# Patient Record
Sex: Male | Born: 1990 | Race: Black or African American | Hispanic: No | Marital: Married | Smoking: Never smoker
Health system: Southern US, Community
[De-identification: ages and names within clinical notes are randomized; demographics above are authoritative.]

## PROBLEM LIST (undated history)

## (undated) HISTORY — PX: TONSILLECTOMY: SUR1361

## (undated) HISTORY — PX: HERNIA REPAIR: SHX51

## (undated) HISTORY — PX: ADENOIDECTOMY: SUR15

---
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
## 2010-11-07 LAB — CBC
Hemoglobin: 15.3 g/dL (ref 12.0–16.0)
MCHC: 34.3 g/dL (ref 31.0–37.0)
Platelets: 328 10*3/uL (ref 150–400)
RBC: 5.09 MIL/uL (ref 3.80–5.70)
RDW: 11.4 % (ref 11.4–15.5)
WBC: 7.2 10*3/uL (ref 4.5–13.5)

## 2010-11-07 LAB — DIFFERENTIAL
Basophils Absolute: 0 10*3/uL (ref 0.0–0.1)
Basophils Relative: 1 % (ref 0–1)
Eosinophils Absolute: 0.1 10*3/uL (ref 0.0–1.2)
Lymphocytes Relative: 18 % — ABNORMAL LOW (ref 24–48)
Monocytes Relative: 11 % (ref 3–11)
Neutro Abs: 5 10*3/uL (ref 1.7–8.0)

## 2010-11-07 LAB — COMPREHENSIVE METABOLIC PANEL
ALT: 26 U/L (ref 0–53)
Albumin: 4.5 g/dL (ref 3.5–5.2)
Alkaline Phosphatase: 110 U/L (ref 52–171)
Glucose, Bld: 85 mg/dL (ref 70–99)
Total Bilirubin: 0.6 mg/dL (ref 0.3–1.2)
Total Protein: 8.5 g/dL — ABNORMAL HIGH (ref 6.0–8.3)

## 2016-11-23 ENCOUNTER — Encounter (HOSPITAL_COMMUNITY): Payer: Self-pay | Admitting: Emergency Medicine

## 2016-11-23 ENCOUNTER — Ambulatory Visit (HOSPITAL_COMMUNITY)
Admission: EM | Admit: 2016-11-23 | Discharge: 2016-11-23 | Disposition: A | Discharge status: Home / Self Care | Payer: Managed Care, Other (non HMO) | Attending: Internal Medicine | Admitting: Internal Medicine

## 2016-11-23 DIAGNOSIS — R109 Unspecified abdominal pain: Secondary | ICD-10-CM

## 2016-11-23 NOTE — Discharge Instructions (Addendum)
Abdominal pain episodes are sharp and brief (seconds).  No danger signs on exam.  Would observe at this point, and recheck for further evaluation if having episodes that are longer (minutes to hours), occurring many times daily, or with new fever >100.5.  Does not seem consistent with appendicitis, kidney stone.  Could be caused by intestinal inflammation or infection (which may resolve on its own) or abdominal wall pain.

## 2016-11-23 NOTE — ED Triage Notes (Signed)
The patient presented to the [facility] with a complaint of lower right side abdominal pain that started yesterday am. The patient denied any N/V/D.

## 2016-11-23 NOTE — ED Provider Notes (Signed)
[facility]    CSN: [id number] Arrival date & time: 11/23/16  4010     History   Chief Complaint Chief Complaint  Patient presents with  . Abdominal Pain    HPI Ruben Banks is a 26 y.o. male. He presents today with 3 episodes, lasting a couple of seconds each time, sharp right lower quadrant discomfort, over the last 24 hours. No nausea/vomiting, appetite is good. No change in bowel habits, had normal bowel movements 2 today which is normal for him. No new urinary frequency, urgency, no discomfort with urination, no hematuria. No fever, no malaise. No upper respiratory symptoms, no cough, no runny/congested nose, no sore throat. Not feeling achy. He had similar symptoms in January of this year, lasted for a couple days and then resolved. He has not been lifting weights, no trauma.    HPI  History reviewed. No pertinent past medical history.   Past Surgical History:  Procedure Laterality Date  . HERNIA REPAIR    . TONSILLECTOMY         Home Medications   Takes no meds regularly  Family History History reviewed. No pertinent family history.  Social History Social History  Substance Use Topics  . Smoking status: Never Smoker  . Smokeless tobacco: Never Used  . Alcohol use No     Allergies   Patient has no known allergies.   Review of Systems Review of Systems  All other systems reviewed and are negative.    Physical Exam Triage Vital Signs ED Triage Vitals  Enc Vitals Group     BP 11/23/16 1919 (!) 150/77     Pulse Rate 11/23/16 1919 84     Resp 11/23/16 1919 18     Temp 11/23/16 1919 98.3 F (36.8 C)     Temp Source 11/23/16 1919 Oral     SpO2 11/23/16 1919 97 %     Weight --      Height --      Pain Score 11/23/16 1918 0     Pain Loc --    Updated Vital Signs BP (!) 150/77 (BP Location: Right Arm)   Pulse 84   Temp 98.3 F (36.8 C) (Oral)   Resp 18   SpO2 97%   Physical Exam  Constitutional: He is oriented to person,  place, and time. No distress.  Alert, nicely groomed  HENT:  Head: Atraumatic.  Eyes:  Conjugate gaze, no eye redness/drainage  Neck: Neck supple.  Cardiovascular: Normal rate and regular rhythm.   Pulmonary/Chest: No respiratory distress. He has no wheezes. He has no rales.  Lungs clear, symmetric breath sounds  Abdominal: Soft. He exhibits no distension. There is no rebound and no guarding. No hernia.  Mild tenderness in the right lower quadrant, diffuse, to deep palpation  Musculoskeletal: Normal range of motion.  Neurological: He is alert and oriented to person, place, and time.  Skin: Skin is warm and dry.  No cyanosis  Nursing note and vitals reviewed.    UC Treatments / Results   Procedures Procedures (including critical care time)  Final Clinical Impressions(s) / UC Diagnoses   Final diagnoses:  Acute abdominal pain   Abdominal pain episodes are sharp and brief (seconds).  No danger signs on exam.  Would observe at this point, and recheck for further evaluation if having episodes that are longer (minutes to hours), occurring many times daily, or with new fever >100.5.  Does not seem consistent with appendicitis, kidney stone.  Could  be caused by intestinal inflammation or infection (which may resolve on its own) or abdominal wall pain.    Eustace Moore, MD 11/26/16 [phone]

## 2016-12-02 ENCOUNTER — Emergency Department (HOSPITAL_BASED_OUTPATIENT_CLINIC_OR_DEPARTMENT_OTHER)
Admission: EM | Admit: 2016-12-02 | Discharge: 2016-12-02 | Disposition: A | Discharge status: Home / Self Care | Payer: Managed Care, Other (non HMO) | Attending: Emergency Medicine | Admitting: Emergency Medicine

## 2016-12-02 ENCOUNTER — Encounter (HOSPITAL_BASED_OUTPATIENT_CLINIC_OR_DEPARTMENT_OTHER): Payer: Self-pay | Admitting: *Deleted

## 2016-12-02 ENCOUNTER — Emergency Department (HOSPITAL_BASED_OUTPATIENT_CLINIC_OR_DEPARTMENT_OTHER): Payer: Managed Care, Other (non HMO)

## 2016-12-02 DIAGNOSIS — R1033 Periumbilical pain: Secondary | ICD-10-CM | POA: Insufficient documentation

## 2016-12-02 DIAGNOSIS — R109 Unspecified abdominal pain: Secondary | ICD-10-CM

## 2016-12-02 DIAGNOSIS — R1031 Right lower quadrant pain: Secondary | ICD-10-CM | POA: Diagnosis not present

## 2016-12-02 LAB — CBC WITH DIFFERENTIAL/PLATELET
BASOS PCT: 1 %
Basophils Absolute: 0.1 10*3/uL (ref 0.0–0.1)
Eosinophils Absolute: 0.1 10*3/uL (ref 0.0–0.7)
Eosinophils Relative: 2 %
HEMATOCRIT: 44.2 % (ref 39.0–52.0)
HEMOGLOBIN: 15.6 g/dL (ref 13.0–17.0)
Lymphocytes Relative: 29 %
Lymphs Abs: 2.2 10*3/uL (ref 0.7–4.0)
MCH: 30.2 pg (ref 26.0–34.0)
MCHC: 35.3 g/dL (ref 30.0–36.0)
MCV: 85.5 fL (ref 78.0–100.0)
MONOS PCT: 13 %
Monocytes Absolute: 1 10*3/uL (ref 0.1–1.0)
NEUTROS ABS: 4.2 10*3/uL (ref 1.7–7.7)
Neutrophils Relative %: 55 %
Platelets: 403 10*3/uL — ABNORMAL HIGH (ref 150–400)
RBC: 5.17 MIL/uL (ref 4.22–5.81)
RDW: 11.9 % (ref 11.5–15.5)
WBC: 7.5 10*3/uL (ref 4.0–10.5)

## 2016-12-02 LAB — URINALYSIS, MICROSCOPIC (REFLEX): RBC / HPF: NONE SEEN RBC/hpf (ref 0–5)

## 2016-12-02 LAB — BASIC METABOLIC PANEL
ANION GAP: 8 (ref 5–15)
BUN: 11 mg/dL (ref 6–20)
CO2: 26 mmol/L (ref 22–32)
CREATININE: 0.77 mg/dL (ref 0.61–1.24)
Calcium: 9.1 mg/dL (ref 8.9–10.3)
Chloride: 99 mmol/L — ABNORMAL LOW (ref 101–111)
GFR calc non Af Amer: >60 mL/min (ref >60)
Glucose, Bld: 100 mg/dL — ABNORMAL HIGH (ref 65–99)
POTASSIUM: 3.9 mmol/L (ref 3.5–5.1)
Sodium: 133 mmol/L — ABNORMAL LOW (ref 135–145)

## 2016-12-02 LAB — URINALYSIS, ROUTINE W REFLEX MICROSCOPIC
Bilirubin Urine: NEGATIVE
Glucose, UA: NEGATIVE mg/dL
Hgb urine dipstick: NEGATIVE
KETONES UR: NEGATIVE mg/dL
LEUKOCYTES UA: NEGATIVE
NITRITE: NEGATIVE
PH: 5.5 (ref 5.0–8.0)
Protein, ur: 100 mg/dL — AB
SPECIFIC GRAVITY, URINE: 1.019 (ref 1.005–1.030)

## 2016-12-02 MED ORDER — IBUPROFEN 600 MG PO TABS: 600.0000 mg | ORAL_TABLET | Freq: Four times a day (QID) | ORAL | 0 refills | Status: AC | PRN

## 2016-12-02 NOTE — ED Provider Notes (Signed)
[facility]   CSN: [id number] Arrival date & time: 12/02/16  1940   History   Chief Complaint Chief Complaint  Patient presents with  . Abdominal Pain    HPI Ruben Banks is a 26 y.o. male.  HPI  At first it also is feeling like his muscles were stretching. Pain was intermittent and. However yesterday pain change in started becoming more constant. States that he is having pain every time he moves. Such activities as walking, riding in a car causing abdominal pain. Describes the pain as a needing no: 2 stomach. He had abdominal pain like this a year ago but it resolved. Denies any changes in stool, nausea, vomiting. No trauma to the abdomen. States that he feels like it feels a little firmer on the right but is not able to feel any discrete mass. Denies any back pain, dysuria. Pain localized to the right of the umbilicus. Denies any abdominal surgeries. No current pain.   History reviewed. No pertinent past medical history.  There are no active problems to display for this patient.   Past Surgical History:  Procedure Laterality Date  . HERNIA REPAIR    . TONSILLECTOMY      Home Medications    Prior to Admission medications   Not on File    Family History No family history on file.  Social History Social History  Substance Use Topics  . Smoking status: Never Smoker  . Smokeless tobacco: Never Used  . Alcohol use No    Allergies   Patient has no known allergies.   Review of Systems Review of Systems  Constitutional: Negative for appetite change and fever.  Respiratory: Negative for shortness of breath.   Cardiovascular: Negative for chest pain.  Gastrointestinal: Positive for abdominal pain. Negative for blood in stool, constipation, diarrhea, nausea and vomiting.  Genitourinary: Negative for dysuria.  Musculoskeletal: Negative for back pain.  Also per HPI  Physical Exam Updated Vital Signs BP 133/80 (BP Location: Right Arm)    Pulse 60   Temp 98.5 F (36.9 C) (Oral)   Resp 16   Ht 5\' 7"  (1.702 m)   Wt 104.3 kg   SpO2 98%   BMI 36.02 kg/m   Physical Exam  Constitutional: He appears well-developed and well-nourished. No distress.  HENT:  Head: Normocephalic and atraumatic.  Mouth/Throat: Oropharynx is clear and moist.  Eyes: Conjunctivae and EOM are normal. Pupils are equal, round, and reactive to light.  Neck: Normal range of motion. Neck supple.  Cardiovascular: Normal rate, regular rhythm and normal heart sounds.   Pulmonary/Chest: Effort normal and breath sounds normal.  Abdominal: Soft. Bowel sounds are normal. He exhibits no distension and no mass. There is no hepatosplenomegaly. There is tenderness in the periumbilical area.  Mild voluntary guarding.   Musculoskeletal: Normal range of motion. He exhibits tenderness.  Neurological: He is alert. No cranial nerve deficit or sensory deficit.  Skin: Skin is warm and dry.  Nursing note and vitals reviewed.   ED Treatments / Results  Labs (all labs ordered are listed, but only abnormal results are displayed) Labs Reviewed  URINALYSIS, ROUTINE W REFLEX MICROSCOPIC - Abnormal; Notable for the following:       Result Value   Protein, ur 100 (*)    All other components within normal limits  CBC WITH DIFFERENTIAL/PLATELET - Abnormal; Notable for the following:    Platelets 403 (*)    All other components within normal limits  BASIC METABOLIC  PANEL - Abnormal; Notable for the following:    Sodium 133 (*)    Chloride 99 (*)    Glucose, Bld 100 (*)    All other components within normal limits  URINALYSIS, MICROSCOPIC (REFLEX) - Abnormal; Notable for the following:    Bacteria, UA RARE (*)    Squamous Epithelial / LPF 0-5 (*)    All other components within normal limits    EKG  EKG Interpretation None       Radiology Dg Abdomen Acute W/chest  Result Date: 12/02/2016 CLINICAL DATA:  26 y/o M; right lower quadrant and periumbilical pain. EXAM:  DG ABDOMEN ACUTE W/ 1V CHEST COMPARISON:  03/29/2004 chest radiograph. FINDINGS: Normal bowel gas pattern. No radiopaque urinary stone is identified. No appreciable pneumoperitoneum. Clear lungs. No pleural effusion or pneumothorax. Normal cardiac silhouette. Mild reverse S curvature of the thoracolumbar spine. No acute osseous abnormality is evident. IMPRESSION: Negative abdominal radiographs.  No acute cardiopulmonary disease. Electronically Signed   By: Mitzi Hansen M.D.   On: 12/02/2016 22:01    Procedures Procedures (including critical care time)  Medications Ordered in ED Medications - No data to display   Initial Impression / Assessment and Plan / ED Course  I have reviewed the triage vital signs and the nursing notes.  Pertinent labs & imaging results that were available during my care of the patient were reviewed by me and considered in my medical decision making (see chart for details).  Patient presents to ED with intermittent abdominal pain of unknown origin. Benign exam here. Vitals are stable and patient afebrile. Abdominal imaging obtained that was unremarkable. Blood work was also benign. Patient did have protein in his urine which he endorses having before. No current pain at this time. Will discharge home in stable condition with follow-up with PCP for further work-up.   Final Clinical Impressions(s) / ED Diagnoses   Final diagnoses:  Abdominal pain, unspecified abdominal location    New Prescriptions New Prescriptions   No medications on file     Pincus Large, DO 12/03/16 1159    Long, Arlyss Repress, MD 12/03/16 1535

## 2016-12-02 NOTE — Discharge Instructions (Signed)
Work-up unremarkable Imaging was normal Recommend you follow-up with your PCP for further testing and possible referral to specialist

## 2016-12-02 NOTE — ED Notes (Signed)
Pt in radiology. Brother at bedside.

## 2016-12-02 NOTE — ED Triage Notes (Signed)
Abdominal pain that feels like a knot that gets worse with stretching, sitting and walking. Right lower quadrant pain.

## 2016-12-05 ENCOUNTER — Ambulatory Visit
Admission: RE | Admit: 2016-12-05 | Discharge: 2016-12-05 | Disposition: A | Discharge status: Home / Self Care | Payer: Managed Care, Other (non HMO) | Source: Ambulatory Visit | Attending: Family Medicine | Admitting: Family Medicine

## 2016-12-05 ENCOUNTER — Other Ambulatory Visit: Payer: Self-pay | Admitting: Family Medicine

## 2016-12-05 DIAGNOSIS — R1033 Periumbilical pain: Secondary | ICD-10-CM

## 2016-12-05 IMAGING — CT CT ABD-PELV W/ CM
1 of 2 series · 15 of 32 positions shown, 19 images · IV contrast (APPLIED)
Comparison: None.

CLINICAL DATA: Periumbilical abdominal for 3 weeks.

EXAM:
CT ABDOMEN AND PELVIS WITH CONTRAST
TECHNIQUE: Multidetector CT imaging of the abdomen and pelvis was performed
using the standard protocol following bolus administration of
intravenous contrast.
CONTRAST:  100 mL [3N]

[Series 2: abd/pelvis w/cm · axial · 0.83mm/px · z∈[-469,-24]mm · 15 of 97 slices shown, 19 images]
[im 4/97  soft-tissue]
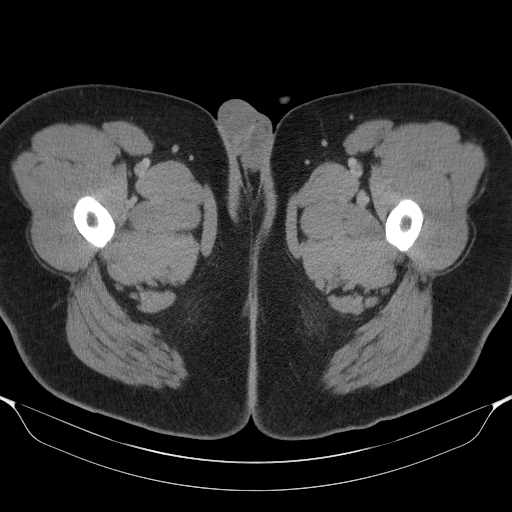
[im 4/97  bone]
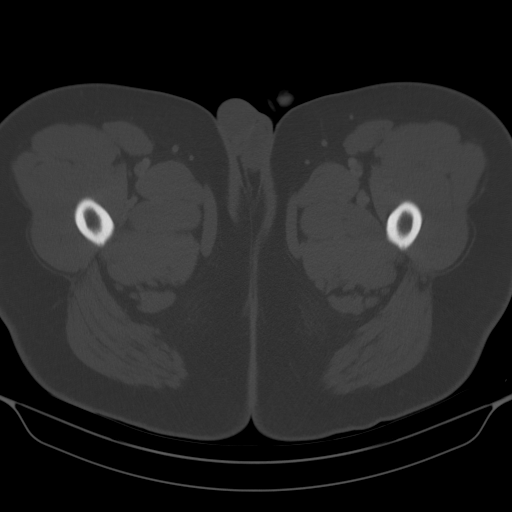
[im 12/97  soft-tissue]
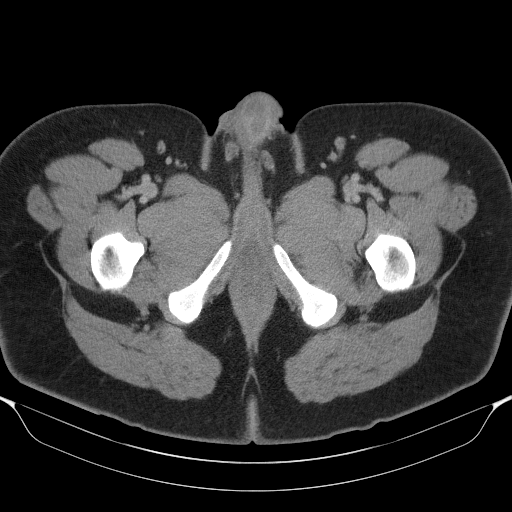
[im 20/97  soft-tissue]
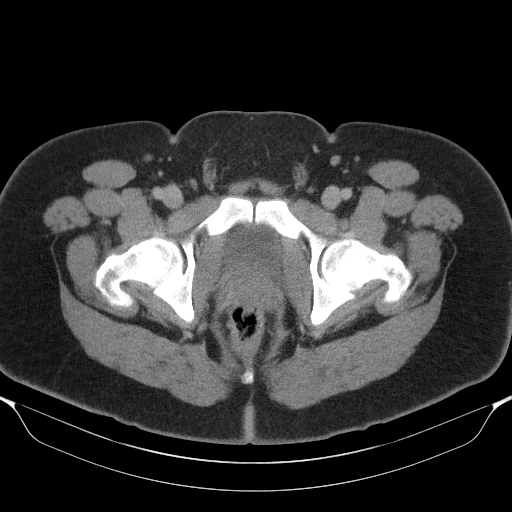
[im 27/97  soft-tissue]
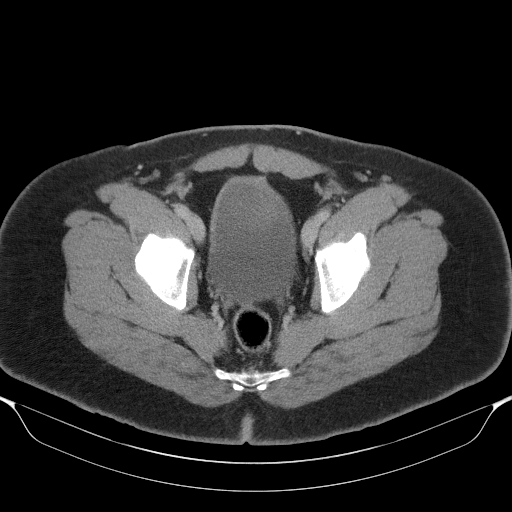
[im 35/97  soft-tissue]
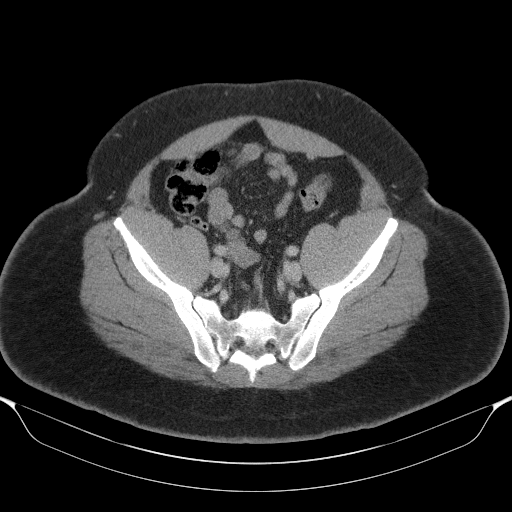
[im 43/97  soft-tissue]
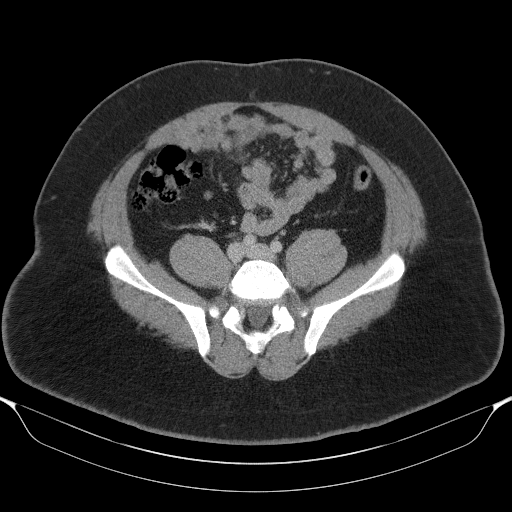
[im 50/97  soft-tissue]
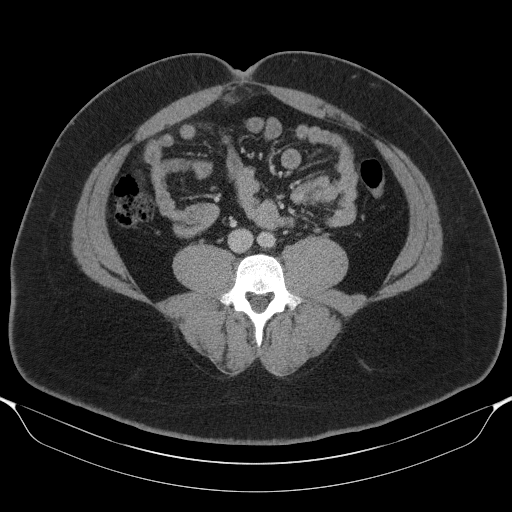
[im 54/97  soft-tissue]
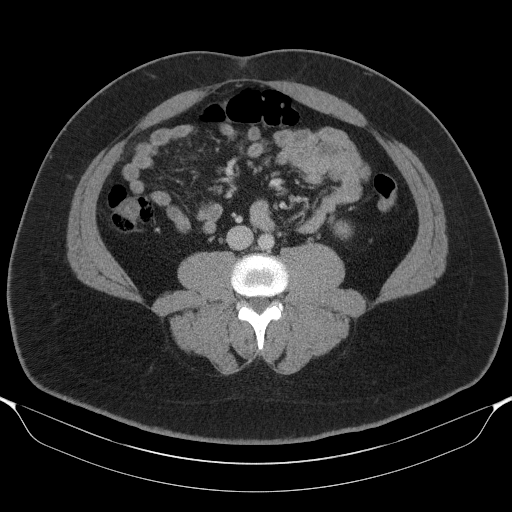
[im 62/97  soft-tissue]
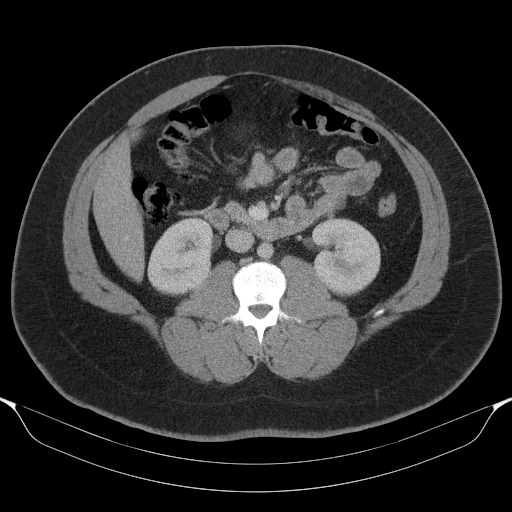
[im 62/97  bone]
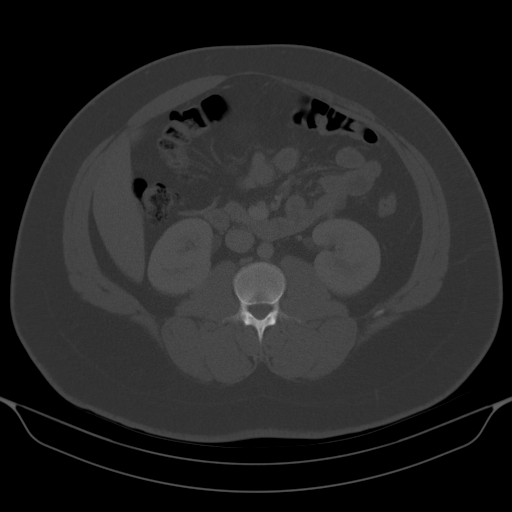
[im 70/97  soft-tissue]
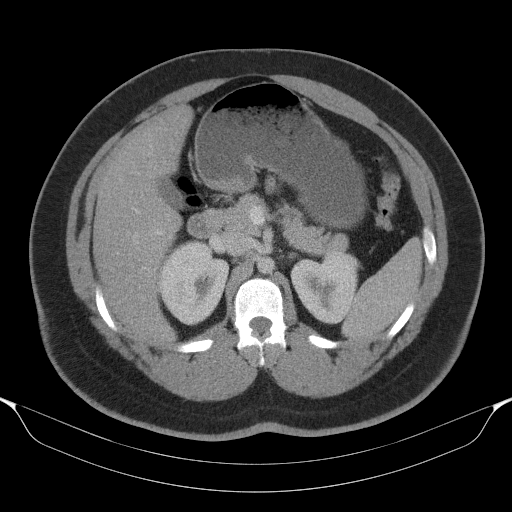
[im 77/97  soft-tissue]
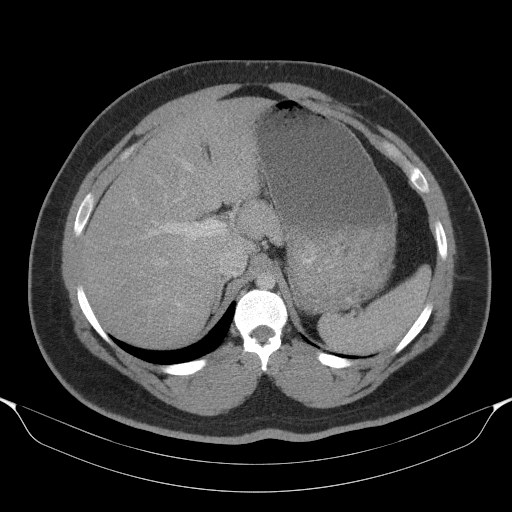
[im 81/97  lung]
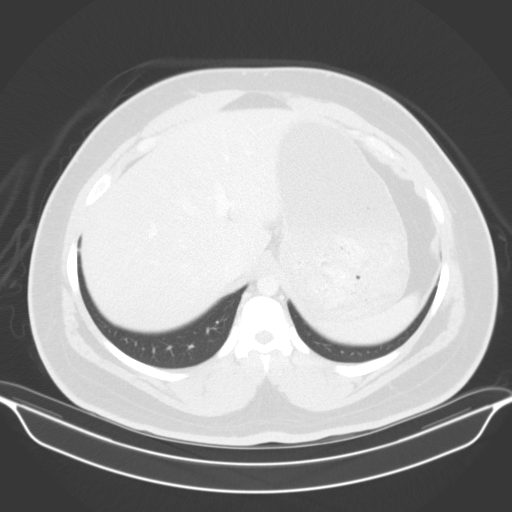
[im 85/97  soft-tissue]
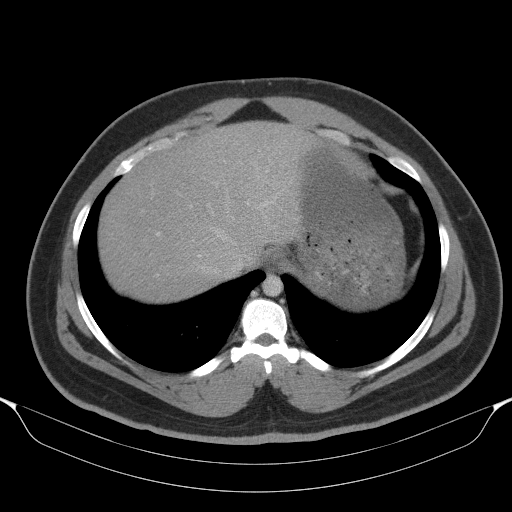
[im 85/97  lung]
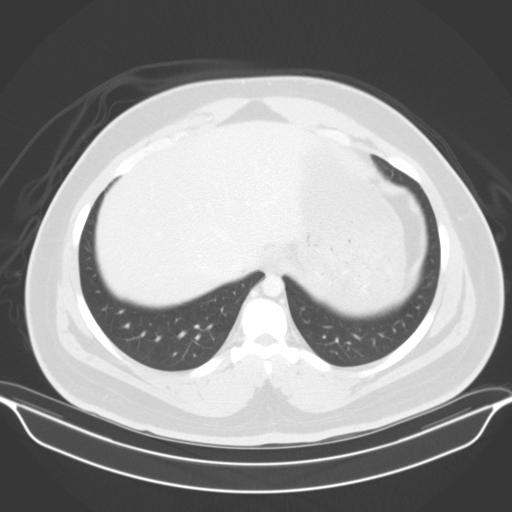
[im 89/97  lung]
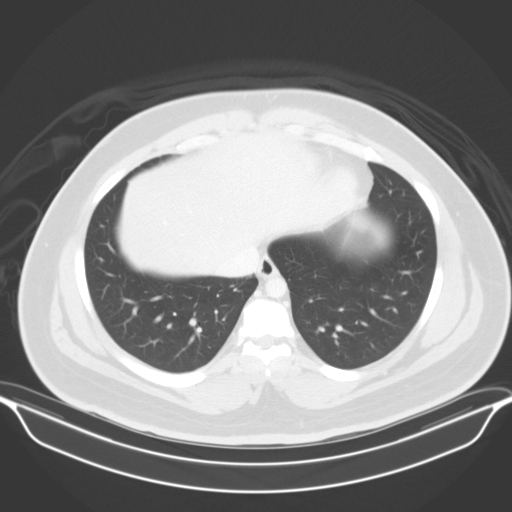
[im 93/97  soft-tissue]
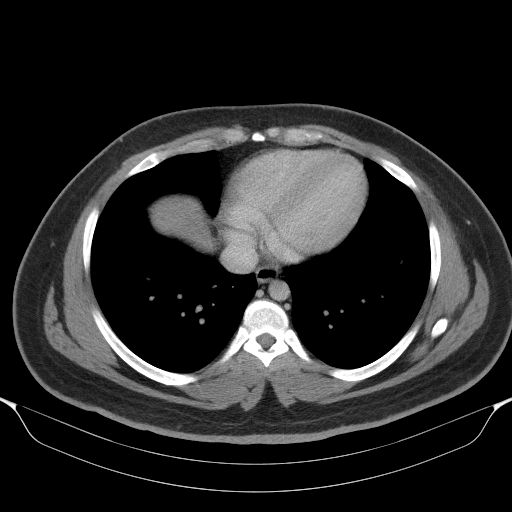
[im 93/97  lung]
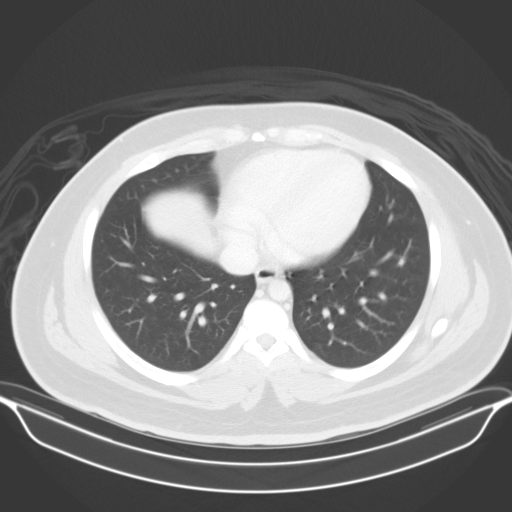

[15 of 32 positions shown; findings below may reference images not displayed]

FINDINGS: Lower Chest: No acute findings.

Hepatobiliary: No masses identified. Mild to moderate diffuse
hepatic steatosis. Gallbladder is unremarkable.

Pancreas:  No mass or inflammatory changes.

Spleen: Within normal limits in size and appearance.

Adrenals/Urinary Tract: No masses identified. No evidence of
hydronephrosis. Unremarkable unopacified urinary bladder.

Stomach/Bowel: No evidence of obstruction, inflammatory process or
abnormal fluid collections.

Vascular/Lymphatic: No pathologically enlarged lymph nodes. No
abdominal aortic aneurysm. Normal appendix visualized.

Reproductive:  No mass or other significant abnormality.

Other: A tiny periumbilical hernia is seen fat. There is mild soft
tissue nodularity within omentum just deep to hernia site which may
be due to fat necrosis. No evidence herniated bowel loops.

Musculoskeletal: No suspicious bone lesions identified. Bilateral L5
pars defects incidentally noted, without associated
spondylolisthesis.
IMPRESSION: Tiny fat-containing periumbilical hernia. Associated mild soft
tissue nodularity within omentum just deep to hernia site, which may
be due to fat necrosis. Consider followup by CT in 3-6 months to
confirm stability.

Hepatic steatosis and bilateral L5 pars defects incidentally noted.

## 2016-12-05 MED ORDER — IOPAMIDOL (ISOVUE-300) INJECTION 61%
100.0000 mL | Freq: Once | INTRAVENOUS | Status: AC | PRN
  Administered 2016-12-05: 100 mL via INTRAVENOUS

## 2019-05-21 ENCOUNTER — Encounter (HOSPITAL_BASED_OUTPATIENT_CLINIC_OR_DEPARTMENT_OTHER): Payer: Self-pay | Admitting: Emergency Medicine

## 2019-05-21 ENCOUNTER — Emergency Department (HOSPITAL_BASED_OUTPATIENT_CLINIC_OR_DEPARTMENT_OTHER)
Admission: EM | Admit: 2019-05-21 | Discharge: 2019-05-21 | Disposition: A | Discharge status: Home / Self Care | Payer: Self-pay | Attending: Emergency Medicine | Admitting: Emergency Medicine

## 2019-05-21 ENCOUNTER — Other Ambulatory Visit: Payer: Self-pay

## 2019-05-21 ENCOUNTER — Emergency Department (HOSPITAL_BASED_OUTPATIENT_CLINIC_OR_DEPARTMENT_OTHER): Payer: Self-pay

## 2019-05-21 DIAGNOSIS — J029 Acute pharyngitis, unspecified: Secondary | ICD-10-CM | POA: Insufficient documentation

## 2019-05-21 DIAGNOSIS — R509 Fever, unspecified: Secondary | ICD-10-CM | POA: Insufficient documentation

## 2019-05-21 DIAGNOSIS — R519 Headache, unspecified: Secondary | ICD-10-CM | POA: Insufficient documentation

## 2019-05-21 LAB — CBC WITH DIFFERENTIAL/PLATELET
Abs Immature Granulocytes: 0.05 10*3/uL (ref 0.00–0.07)
Basophils Absolute: 0 10*3/uL (ref 0.0–0.1)
Basophils Relative: 1 %
Eosinophils Absolute: 0.1 10*3/uL (ref 0.0–0.5)
Eosinophils Relative: 2 %
HCT: 39.4 % (ref 39.0–52.0)
Hemoglobin: 13 g/dL (ref 13.0–17.0)
Immature Granulocytes: 1 %
Lymphocytes Relative: 16 %
Lymphs Abs: 1.3 10*3/uL (ref 0.7–4.0)
MCH: 28 pg (ref 26.0–34.0)
MCHC: 33 g/dL (ref 30.0–36.0)
MCV: 84.7 fL (ref 80.0–100.0)
Monocytes Absolute: 1.2 10*3/uL — ABNORMAL HIGH (ref 0.1–1.0)
Monocytes Relative: 15 %
Neutro Abs: 5.2 10*3/uL (ref 1.7–7.7)
Neutrophils Relative %: 65 %
Platelets: 341 10*3/uL (ref 150–400)
RBC: 4.65 MIL/uL (ref 4.22–5.81)
RDW: 11.3 % — ABNORMAL LOW (ref 11.5–15.5)
WBC: 7.8 10*3/uL (ref 4.0–10.5)
nRBC: 0 % (ref 0.0–0.2)

## 2019-05-21 LAB — BASIC METABOLIC PANEL
Anion gap: 10 (ref 5–15)
BUN: 7 mg/dL (ref 6–20)
CO2: 21 mmol/L — ABNORMAL LOW (ref 22–32)
Calcium: 8.7 mg/dL — ABNORMAL LOW (ref 8.9–10.3)
Chloride: 101 mmol/L (ref 98–111)
Creatinine, Ser: 0.81 mg/dL (ref 0.61–1.24)
GFR calc Af Amer: >60 mL/min (ref >60)
GFR calc non Af Amer: >60 mL/min (ref >60)
Glucose, Bld: 220 mg/dL — ABNORMAL HIGH (ref 70–99)
Potassium: 3.6 mmol/L (ref 3.5–5.1)
Sodium: 132 mmol/L — ABNORMAL LOW (ref 135–145)

## 2019-05-21 LAB — MONONUCLEOSIS SCREEN: Mono Screen: NEGATIVE

## 2019-05-21 MED ORDER — IOHEXOL 300 MG/ML  SOLN
75.0000 mL | Freq: Once | INTRAMUSCULAR | Status: AC | PRN
  Administered 2019-05-21: 75 mL via INTRAVENOUS

## 2019-05-21 MED ORDER — CLINDAMYCIN HCL 300 MG PO CAPS: 300.0000 mg | ORAL_CAPSULE | Freq: Three times a day (TID) | ORAL | 0 refills | Status: AC

## 2019-05-21 MED ORDER — ACETAMINOPHEN 500 MG PO TABS
1000.0000 mg | ORAL_TABLET | Freq: Once | ORAL | Status: DC
  Filled 2019-05-21: qty 2

## 2019-05-21 MED ORDER — CLINDAMYCIN PHOSPHATE 600 MG/50ML IV SOLN
600.0000 mg | Freq: Once | INTRAVENOUS | Status: AC
  Administered 2019-05-21: 11:00:00 600 mg via INTRAVENOUS
  Filled 2019-05-21: qty 50

## 2019-05-21 MED ORDER — SODIUM CHLORIDE 0.9 % IV BOLUS
1000.0000 mL | Freq: Once | INTRAVENOUS | Status: AC
  Administered 2019-05-21: 1000 mL via INTRAVENOUS

## 2019-05-21 MED ORDER — DEXAMETHASONE SODIUM PHOSPHATE 10 MG/ML IJ SOLN
10.0000 mg | Freq: Once | INTRAMUSCULAR | Status: AC
  Administered 2019-05-21: 11:00:00 10 mg via INTRAVENOUS
  Filled 2019-05-21: qty 1

## 2019-05-21 MED ORDER — KETOROLAC TROMETHAMINE 30 MG/ML IJ SOLN
30.0000 mg | Freq: Once | INTRAMUSCULAR | Status: AC
  Administered 2019-05-21: 11:00:00 30 mg via INTRAVENOUS
  Filled 2019-05-21: qty 1

## 2019-05-21 NOTE — ED Triage Notes (Signed)
Pt here with sore thraot x 1 weeks with white patches visible on assessment. States he has been taking motrin and tylenol as directed with no control over his very high fever. Had COVID test, strep test and flu test and all came back negative, but fever continues to get worse as well as pain.

## 2019-05-21 NOTE — ED Notes (Signed)
Pt is drinking water and tolerating it without N/V or pain

## 2019-05-21 NOTE — ED Provider Notes (Signed)
[facility] EM[facility]ENCY DEPARTMENT Provider Note   CSN: [id number] Arrival date & time: 05/21/19  0930    History   Chief Complaint Chief Complaint  Patient presents with  . Sore Throat  . Fever    HPI Ruben Banks is a 28 y.o. male with no significant past medical history who presents for evaluation of sore throat and fever. Seen 2x prior PTA. Negative for COVID, Strep and Flu x 2. Worsening sore throat however able to tolerate PO intake. Fever up to 103 at home. Taking Tylenol and Ibuprofen.Last Tylenol, Ibuprofen at 0730 this morning. Feel sore throat worse on right side. Has mid HA, congestion 3 days ago however none currently. No known COVId exposures. Denies neck pain, neck stiffness, drooling, dysphagia, trismus, CP, SOB, hemoptysis, Abd pain, diarrhea, constipation, dysuria, rash. Current pain a 10/10. Described as scratchy.  History obtained from patient and past medical records. No interpretor was used.     HPI  History reviewed. No pertinent past medical history.  There are no active problems to display for this patient.   Past Surgical History:  Procedure Laterality Date  . HERNIA REPAIR    . TONSILLECTOMY          Home Medications    Prior to Admission medications   Medication Sig Start Date End Date Taking? Authorizing Provider  clindamycin (CLEOCIN) 300 MG capsule Take 1 capsule (300 mg total) by mouth 3 (three) times daily for 7 days. 05/21/19 05/28/19  Elidia Bonenfant A, PA-C  ibuprofen (ADVIL,MOTRIN) 600 MG tablet Take 1 tablet (600 mg total) by mouth every 6 (six) hours as needed for moderate pain. 12/02/16   Pincus LargePhelps, Jazma Y, DO    Family History History reviewed. No pertinent family history.  Social History Social History   Tobacco Use  . Smoking status: Never Smoker  . Smokeless tobacco: Never Used  Substance Use Topics  . Alcohol use: No  . Drug use: No     Allergies   Patient has no known allergies.   Review of Systems  Review of Systems  Constitutional: Positive for chills and fever. Negative for activity change, appetite change, diaphoresis and fatigue.  HENT: Positive for congestion (3 days ago, none currently) and sore throat. Negative for postnasal drip, rhinorrhea, sinus pressure, sinus pain, sneezing, trouble swallowing and voice change.   Respiratory: Negative.   Cardiovascular: Negative.   Gastrointestinal: Negative.   Genitourinary: Negative.   Musculoskeletal: Negative.   Skin: Negative.   Neurological: Positive for headaches (3 days ago, none currently). Negative for dizziness, seizures, facial asymmetry, weakness and numbness.  All other systems reviewed and are negative.    Physical Exam Updated Vital Signs BP (!) 147/82   Pulse 77   Temp 98.4 F (36.9 C)   Resp 20   SpO2 98%   Physical Exam Vitals signs and nursing note reviewed.  Constitutional:      General: He is not in acute distress.    Appearance: He is not ill-appearing, toxic-appearing or diaphoretic.  HENT:     Head: Normocephalic and atraumatic.     Jaw: There is normal jaw occlusion.     Right Ear: Tympanic membrane, ear canal and external ear normal. There is no impacted cerumen. No hemotympanum. Tympanic membrane is not injected, scarred, perforated, erythematous, retracted or bulging.     Left Ear: Tympanic membrane, ear canal and external ear normal. There is no impacted cerumen. No hemotympanum. Tympanic membrane is not injected, scarred, perforated, erythematous, retracted  or bulging.     Ears:     Comments: No Mastoid tenderness.    Nose:     Comments: Clear rhinorrhea and congestion to bilateral nares.  No sinus tenderness.    Mouth/Throat:     Mouth: Mucous membranes are dry.     Tongue: No lesions.     Pharynx: Uvula midline. Posterior oropharyngeal erythema present. No pharyngeal swelling, oropharyngeal exudate or uvula swelling.     Tonsils: Tonsillar exudate present. No tonsillar abscesses. 3+ on the  right. 2+ on the left.     Comments: Mucous membranes dry. Posterior oropharynx erythematous. Tonsils with erythema and exudate. Left tonsil 2+ right 3+  Uvula midline without deviation.  No evidence of PTA or RPA.  No drooling, dysphasia or trismus.  Phonation normal. Neck:     Trachea: Trachea and phonation normal.     Meningeal: Brudzinski's sign and Kernig's sign absent.     Comments: No Neck stiffness or neck rigidity.  No meningismus.  No cervical lymphadenopathy. Cardiovascular:     Comments: No murmurs rubs or gallops. Pulmonary:     Comments: Clear to auscultation bilaterally without wheeze, rhonchi or rales.  No accessory muscle usage.  Able speak in full sentences. Abdominal:     Comments: Soft, nontender without rebound or guarding.  No CVA tenderness.  Musculoskeletal:     Comments: Moves all 4 extremities without difficulty.  Lower extremities without edema, erythema or warmth.  Skin:    Comments: Brisk capillary refill.  No rashes or lesions.  Neurological:     Mental Status: He is alert.     Comments: Ambulatory in department without difficulty.  Cranial nerves II through XII grossly intact.  No facial droop.  No aphasia.      ED Treatments / Results  Labs (all labs ordered are listed, but only abnormal results are displayed) Labs Reviewed  CBC WITH DIFFERENTIAL/PLATELET - Abnormal; Notable for the following components:      Result Value   RDW 11.3 (*)    Monocytes Absolute 1.2 (*)    All other components within normal limits  BASIC METABOLIC PANEL - Abnormal; Notable for the following components:   Sodium 132 (*)    CO2 21 (*)    Glucose, Bld 220 (*)    Calcium 8.7 (*)    All other components within normal limits  MONONUCLEOSIS SCREEN    EKG None  Radiology Ct Soft Tissue Neck W Contrast  Result Date: 05/21/2019 CLINICAL DATA:  Sore throat, stridor EXAM: CT NECK WITH CONTRAST TECHNIQUE: Multidetector CT imaging of the neck was performed using the  standard protocol following the bolus administration of intravenous contrast. CONTRAST:  55mL OMNIPAQUE IOHEXOL 300 MG/ML  SOLN COMPARISON:  None. FINDINGS: Pharynx and larynx: Diffuse hypertrophy of adenoid and tonsils bilaterally without mass or abscess. This is causing narrowing of the nasopharyngeal airway. Oro pharyngeal airway intact. Larynx and epiglottis normal. Salivary glands: No inflammation, mass, or stone. Thyroid: Negative Lymph nodes: Multiple lymph nodes in the neck bilaterally. Largest lymph nodes include right level 2 lymph node measuring 11 mm. Left level 2 lymph node 14 mm. No nodal necrosis. Submental lymph node 12 mm. Vascular: Normal vascular enhancement. Limited intracranial: Negative Visualized orbits: Negative Mastoids and visualized paranasal sinuses: Negative Skeleton: Negative Upper chest: Lung apices clear bilaterally Other: None IMPRESSION: Diffuse and symmetric enlargement of the adenoid and tonsils bilaterally compatible with pharyngitis. No abscess identified. Nasopharyngeal airway is narrowed however the or pharyngeal airway is normal.  Reactive lymph nodes in the neck bilaterally Electronically Signed   By: Marlan Palau M.D.   On: 05/21/2019 12:26    Procedures Procedures (including critical care time)  Medications Ordered in ED Medications  acetaminophen (TYLENOL) tablet 1,000 mg (1,000 mg Oral Not Given 05/21/19 1221)  sodium chloride 0.9 % bolus 1,000 mL (0 mLs Intravenous Stopped 05/21/19 1141)  clindamycin (CLEOCIN) IVPB 600 mg (0 mg Intravenous Stopped 05/21/19 1141)  dexamethasone (DECADRON) injection 10 mg (10 mg Intravenous Given 05/21/19 1036)  ketorolac (TORADOL) 30 MG/ML injection 30 mg (30 mg Intravenous Given 05/21/19 1047)  iohexol (OMNIPAQUE) 300 MG/ML solution 75 mL (75 mLs Intravenous Contrast Given 05/21/19 1210)    Initial Impression / Assessment and Plan / ED Course  I have reviewed the triage vital signs and the nursing notes.  Pertinent  labs & imaging results that were available during my care of the patient were reviewed by me and considered in my medical decision making (see chart for details).  28 year old presents for evaluation of sore thorat and fever. Febrile and tachycardic here however patient appears well, non septic. Tonsil with erythema and exudate. Uvula midline,no deviation. No drooling, dysphagia or trismus.  Shared decision-making whether to obtain CT soft tissue neck to assess for abscess given his chronic sore throat multiple negative strep test, flu and Covid testing.  Patient request CT scan which I feel is reasonable at this time given his additional work-up.  Will also obtain mono screen.  Took Tylenol and ibuprofen at 0730.  Will give Toradol, Decadron, clindamycin, IV fluids, Tylenol at 1130.  CBC without leukocytosis Metabolic panel with mild hyponatremia at 132, elevated glucose at 220.  Patient follow-up with PCP for elevated glucose. CT soft tissue neck consistent with pharyngitis.  Will treat symptomatically with antibiotics given CT scan.  No evidence of PTA, RPA.  Vital signs with significant improvement with antipyretics.  Temperature 98.4, tachycardia resolved to 77 after fluids and antipyretics.  Do not feel he has signs of sepsis and continues to appear overall well.  Low suspicion for bacteremia.  He is tolerating p.o. intake without difficulty.  To follow-up with ENT if his symptoms unresolved.  Will DC home with clindamycin.  Tylenol and ibuprofen as needed for pain.  Encouraged  Fluids. Pain improved in ED. Vs stable. Discussed strict return precaution and patient voiced understanding.  The patient has been appropriately medically screened and/or stabilized in the ED. I have low suspicion for any other emergent medical condition which would require further screening, evaluation or treatment in the ED or require inpatient management.      Final Clinical Impressions(s) / ED Diagnoses   Final  diagnoses:  Pharyngitis, unspecified etiology    ED Discharge Orders         Ordered    clindamycin (CLEOCIN) 300 MG capsule  3 times daily     05/21/19 1237           Ravonda Brecheen A, PA-C 05/21/19 1239    Little, Ambrose Finland, MD 05/22/19 [phone]

## 2019-05-21 NOTE — Discharge Instructions (Signed)
Take the antibiotics as prescribed. Drink plenty of fluids. Follow up with ENT listed on discharge paperwork if symptoms unresolved.  Tylenol and ibuprofen as needed for pain.  Do not take more than 4000 mg Tylenol, more than 2400 mg ibuprofen in 24.

## 2020-07-26 ENCOUNTER — Emergency Department (HOSPITAL_BASED_OUTPATIENT_CLINIC_OR_DEPARTMENT_OTHER)
Admission: EM | Admit: 2020-07-26 | Discharge: 2020-07-26 | Disposition: A | Discharge status: Home / Self Care | Payer: BC Managed Care – PPO | Attending: Emergency Medicine | Admitting: Emergency Medicine

## 2020-07-26 ENCOUNTER — Encounter (HOSPITAL_BASED_OUTPATIENT_CLINIC_OR_DEPARTMENT_OTHER): Payer: Self-pay | Admitting: Emergency Medicine

## 2020-07-26 ENCOUNTER — Other Ambulatory Visit: Payer: Self-pay

## 2020-07-26 DIAGNOSIS — Z5321 Procedure and treatment not carried out due to patient leaving prior to being seen by health care provider: Secondary | ICD-10-CM | POA: Insufficient documentation

## 2020-07-26 DIAGNOSIS — Z202 Contact with and (suspected) exposure to infections with a predominantly sexual mode of transmission: Secondary | ICD-10-CM | POA: Diagnosis not present

## 2020-07-26 NOTE — ED Triage Notes (Signed)
Pt states he recently had unprotected sex with an unknown person and would like to be tested for STDs  Denies any symptoms at this time

## 2020-07-29 LAB — GC/CHLAMYDIA PROBE AMP (~~LOC~~) NOT AT ARMC
Chlamydia: NEGATIVE
Comment: NEGATIVE
Comment: NORMAL
Neisseria Gonorrhea: NEGATIVE
